# Patient Record
Sex: Male | Born: 1990 | Race: White | Hispanic: No | Marital: Single | Smoking: Current every day smoker
Health system: Southern US, Community
[De-identification: ages and names within clinical notes are randomized; demographics above are authoritative.]

---

## 2009-01-07 ENCOUNTER — Emergency Department (HOSPITAL_COMMUNITY): Admission: EM | Admit: 2009-01-07 | Discharge: 2009-01-07 | Payer: Self-pay | Admitting: Emergency Medicine

## 2009-04-04 ENCOUNTER — Emergency Department (HOSPITAL_BASED_OUTPATIENT_CLINIC_OR_DEPARTMENT_OTHER): Admission: EM | Admit: 2009-04-04 | Discharge: 2009-04-04 | Payer: Self-pay | Admitting: Emergency Medicine

## 2009-04-04 ENCOUNTER — Ambulatory Visit: Payer: Self-pay | Admitting: Diagnostic Radiology
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
## 2021-12-10 ENCOUNTER — Encounter (HOSPITAL_COMMUNITY): Payer: Self-pay | Admitting: Emergency Medicine

## 2021-12-10 ENCOUNTER — Emergency Department (HOSPITAL_COMMUNITY)
Admission: EM | Admit: 2021-12-10 | Discharge: 2021-12-11 | Disposition: A | Discharge status: Other Acute Inpatient Hospital | Payer: BC Managed Care – PPO | Attending: Emergency Medicine | Admitting: Emergency Medicine

## 2021-12-10 ENCOUNTER — Other Ambulatory Visit: Payer: Self-pay

## 2021-12-10 DIAGNOSIS — R45851 Suicidal ideations: Secondary | ICD-10-CM | POA: Insufficient documentation

## 2021-12-10 DIAGNOSIS — Z20822 Contact with and (suspected) exposure to covid-19: Secondary | ICD-10-CM | POA: Diagnosis not present

## 2021-12-10 DIAGNOSIS — F332 Major depressive disorder, recurrent severe without psychotic features: Secondary | ICD-10-CM | POA: Diagnosis present

## 2021-12-10 LAB — CBC WITH DIFFERENTIAL/PLATELET
Abs Immature Granulocytes: 0.02 10*3/uL (ref 0.00–0.07)
Basophils Absolute: 0 10*3/uL (ref 0.0–0.1)
Basophils Relative: 1 %
Eosinophils Absolute: 0.2 10*3/uL (ref 0.0–0.5)
Eosinophils Relative: 3 %
HCT: 45.2 % (ref 39.0–52.0)
Hemoglobin: 15.5 g/dL (ref 13.0–17.0)
Immature Granulocytes: 0 %
Lymphocytes Relative: 31 %
Lymphs Abs: 2.2 10*3/uL (ref 0.7–4.0)
MCH: 29 pg (ref 26.0–34.0)
MCHC: 34.3 g/dL (ref 30.0–36.0)
MCV: 84.6 fL (ref 80.0–100.0)
Monocytes Absolute: 0.5 10*3/uL (ref 0.1–1.0)
Monocytes Relative: 7 %
Neutro Abs: 4 10*3/uL (ref 1.7–7.7)
Neutrophils Relative %: 58 %
Platelets: 249 10*3/uL (ref 150–400)
RBC: 5.34 MIL/uL (ref 4.22–5.81)
RDW: 12.9 % (ref 11.5–15.5)
WBC: 6.9 10*3/uL (ref 4.0–10.5)
nRBC: 0 % (ref 0.0–0.2)

## 2021-12-10 LAB — COMPREHENSIVE METABOLIC PANEL
ALT: 55 U/L — ABNORMAL HIGH (ref 0–44)
AST: 41 U/L (ref 15–41)
Albumin: 4.3 g/dL (ref 3.5–5.0)
Alkaline Phosphatase: 64 U/L (ref 38–126)
Anion gap: 10 (ref 5–15)
BUN: 19 mg/dL (ref 6–20)
CO2: 23 mmol/L (ref 22–32)
Calcium: 9.2 mg/dL (ref 8.9–10.3)
Chloride: 103 mmol/L (ref 98–111)
Creatinine, Ser: 0.93 mg/dL (ref 0.61–1.24)
GFR, Estimated: >60 mL/min (ref >60)
Glucose, Bld: 115 mg/dL — ABNORMAL HIGH (ref 70–99)
Potassium: 3.9 mmol/L (ref 3.5–5.1)
Sodium: 136 mmol/L (ref 135–145)
Total Bilirubin: 0.3 mg/dL (ref 0.3–1.2)
Total Protein: 7.4 g/dL (ref 6.5–8.1)

## 2021-12-10 LAB — RESP PANEL BY RT-PCR (FLU A&B, COVID) ARPGX2
Influenza A by PCR: NEGATIVE
Influenza B by PCR: NEGATIVE
SARS Coronavirus 2 by RT PCR: NEGATIVE

## 2021-12-10 LAB — RAPID URINE DRUG SCREEN, HOSP PERFORMED
Amphetamines: NOT DETECTED
Barbiturates: NOT DETECTED
Benzodiazepines: NOT DETECTED
Cocaine: NOT DETECTED
Opiates: NOT DETECTED
Tetrahydrocannabinol: NOT DETECTED

## 2021-12-10 LAB — SALICYLATE LEVEL: Salicylate Lvl: <7 mg/dL — ABNORMAL LOW (ref 7.0–30.0)

## 2021-12-10 LAB — ETHANOL: Alcohol, Ethyl (B): <10 mg/dL (ref <10)

## 2021-12-10 LAB — ACETAMINOPHEN LEVEL: Acetaminophen (Tylenol), Serum: <10 ug/mL — ABNORMAL LOW (ref 10–30)

## 2021-12-10 MED ORDER — LORAZEPAM 1 MG PO TABS: 1.0000 mg | ORAL_TABLET | ORAL | Status: DC | PRN

## 2021-12-10 NOTE — ED Triage Notes (Signed)
Pt states he just moved here from [place].  Was in a treatment program there for depression but now has moved here to go to college.  Pt states he has a lot of stressors in his life and feels that he wants to die.  Has a knife with plans to end his life.

## 2021-12-10 NOTE — ED Notes (Signed)
The pt is s;eepy

## 2021-12-10 NOTE — ED Provider Triage Note (Signed)
Emergency Medicine Provider Triage Evaluation Note  Benjamin Rivers , a 31 y.o. male  was evaluated in triage.  Pt complains of SI. No prior episode in the past or prior attempts but reports a "back up plan". No HI.  Review of Systems  Positive: Si Negative: Cp, sob, abdominal pain  Physical Exam  BP (!) 148/89   Pulse (!) 106   Temp 99.4 F (37.4 C) (Oral)   Resp 20   SpO2 100%  Gen:   Awake, no distress   Resp:  Normal effort  MSK:   Moves extremities without difficulty  Other:    Medical Decision Making  Medically screening exam initiated at 7:02 PM.  Appropriate orders placed.  Tyquavious Bowden was informed that the remainder of the evaluation will be completed by another provider, this initial triage assessment does not replace that evaluation, and the importance of remaining in the ED until their evaluation is complete.     Claude Manges, PA-C 12/10/21 1905

## 2021-12-10 NOTE — ED Notes (Signed)
Pt was wanded by security and belongings secured. Security Radio producer and locked up in security.

## 2021-12-10 NOTE — ED Notes (Signed)
PT belongings inventoried and placed in locker 6. PT resting at this time. PT has been calm and cooperative.

## 2021-12-10 NOTE — ED Provider Notes (Signed)
[facility] EMERGENCY DEPARTMENT Provider Note   CSN: [id number] Arrival date & time: 12/10/21  1824     History  Chief Complaint  Patient presents with   Suicidal    El Pile is a 31 y.o. male.  HPI  31 year old male with medical history sniffer ADHD and depression who presents to the emergency department with suicidal ideation with a plan.  He states that he has had some conflicts with his family and is new to the area.  He has had thoughts of hurting himself for the last 2 weeks.  His plan will be to stab himself with a knife in a bathtub.  He denies any HI or AVH.  He states that he is on Concerta and an antidepressant.  He presents voluntarily for evaluation.  Home Medications Prior to Admission medications   Not on File      Allergies    Patient has no known allergies.    Review of Systems   Review of Systems  All other systems reviewed and are negative.   Physical Exam Updated Vital Signs BP (!) 148/89   Pulse (!) 106   Temp 99.4 F (37.4 C) (Oral)   Resp 20   SpO2 100%  Physical Exam Vitals and nursing note reviewed.  Constitutional:      General: He is not in acute distress. HENT:     Head: Normocephalic and atraumatic.  Eyes:     Conjunctiva/sclera: Conjunctivae normal.     Pupils: Pupils are equal, round, and reactive to light.  Cardiovascular:     Rate and Rhythm: Normal rate and regular rhythm.  Pulmonary:     Effort: Pulmonary effort is normal. No respiratory distress.  Abdominal:     General: There is no distension.     Tenderness: There is no guarding.  Musculoskeletal:        General: No deformity or signs of injury.     Cervical back: Neck supple.  Skin:    Findings: No lesion or rash.  Neurological:     General: No focal deficit present.     Mental Status: He is alert. Mental status is at baseline.  Psychiatric:        Thought Content: Thought content includes suicidal ideation. Thought content includes  suicidal plan.     ED Results / Procedures / Treatments   Labs (all labs ordered are listed, but only abnormal results are displayed) Labs Reviewed  COMPREHENSIVE METABOLIC PANEL - Abnormal; Notable for the following components:      Result Value   Glucose, Bld 115 (*)    ALT 55 (*)    All other components within normal limits  SALICYLATE LEVEL - Abnormal; Notable for the following components:   Salicylate Lvl <7.0 (*)    All other components within normal limits  ACETAMINOPHEN LEVEL - Abnormal; Notable for the following components:   Acetaminophen (Tylenol), Serum <10 (*)    All other components within normal limits  RESP PANEL BY RT-PCR (FLU A&B, COVID) ARPGX2  ETHANOL  RAPID URINE DRUG SCREEN, HOSP PERFORMED  CBC WITH DIFFERENTIAL/PLATELET    EKG EKG Interpretation  Date/Time:  Wednesday December 10 2021 21:04:15 EDT Ventricular Rate:  83 PR Interval:  154 QRS Duration: 98 QT Interval:  348 QTC Calculation: 408 R Axis:   95 Text Interpretation: Normal sinus rhythm with sinus arrhythmia Rightward axis Inferior infarct , age undetermined Abnormal ECG No previous ECGs available Confirmed by Ernie Avena ([id number]) on 12/10/2021 9:45:44  PM  Radiology No results found.  Procedures Procedures    Medications Ordered in ED Medications  LORazepam (ATIVAN) tablet 1 mg (has no administration in time range)    ED Course/ Medical Decision Making/ A&P                           Medical Decision Making Risk Prescription drug management.   31 year old male with medical history sniffer ADHD and depression who presents to the emergency department with suicidal ideation with a plan.  He states that he has had some conflicts with his family and is new to the area.  He has had thoughts of hurting himself for the last 2 weeks.  His plan will be to stab himself with a knife in a bathtub.  He denies any HI or AVH.  He states that he is on Concerta and an antidepressant.  He presents  voluntarily for evaluation.  On arrival, the patient was vitally stable, afebrile, mildly tachycardic P106, anxious appearing, otherwise reassuring physical exam.  Presenting with SI and a plan in the setting of family stressors.  Screens high risk by the [place] suicide risk rating tool, here voluntarily.  Medically clear for TTS evaluation at this time.     Final Clinical Impression(s) / ED Diagnoses Final diagnoses:  Suicidal ideation    Rx / DC Orders ED Discharge Orders     None         Ernie Avena, MD 12/10/21 2146

## 2021-12-11 ENCOUNTER — Encounter (HOSPITAL_COMMUNITY): Payer: Self-pay

## 2021-12-11 ENCOUNTER — Ambulatory Visit (HOSPITAL_COMMUNITY)
Admission: EM | Admit: 2021-12-11 | Discharge: 2021-12-12 | Disposition: A | Discharge status: Home / Self Care | Payer: BC Managed Care – PPO | Attending: Psychiatry | Admitting: Psychiatry

## 2021-12-11 DIAGNOSIS — R45851 Suicidal ideations: Secondary | ICD-10-CM

## 2021-12-11 MED ORDER — HYDROXYZINE HCL 25 MG PO TABS
25.0000 mg | ORAL_TABLET | Freq: Three times a day (TID) | ORAL | Status: DC | PRN
  Administered 2021-12-11: 25 mg via ORAL
  Filled 2021-12-11: qty 1

## 2021-12-11 MED ORDER — ALUM & MAG HYDROXIDE-SIMETH 200-200-20 MG/5ML PO SUSP: 30.0000 mL | ORAL | Status: DC | PRN

## 2021-12-11 MED ORDER — ACETAMINOPHEN 325 MG PO TABS: 650.0000 mg | ORAL_TABLET | Freq: Four times a day (QID) | ORAL | Status: DC | PRN

## 2021-12-11 MED ORDER — LURASIDONE HCL 60 MG PO TABS
1.0000 | ORAL_TABLET | Freq: Every day | ORAL | Status: DC
  Administered 2021-12-11: 60 mg via ORAL
  Filled 2021-12-11: qty 1

## 2021-12-11 MED ORDER — NICOTINE 14 MG/24HR TD PT24
14.0000 mg | MEDICATED_PATCH | Freq: Every day | TRANSDERMAL | Status: DC
  Administered 2021-12-11 – 2021-12-12 (×2): 14 mg via TRANSDERMAL
  Filled 2021-12-11 (×2): qty 1

## 2021-12-11 MED ORDER — DESVENLAFAXINE SUCCINATE ER 50 MG PO TB24
100.0000 mg | ORAL_TABLET | Freq: Every day | ORAL | Status: DC
  Administered 2021-12-11 – 2021-12-12 (×2): 100 mg via ORAL
  Filled 2021-12-11 (×2): qty 2

## 2021-12-11 MED ORDER — CLONIDINE HCL 0.1 MG PO TABS: 0.1000 mg | ORAL_TABLET | Freq: Two times a day (BID) | ORAL | Status: DC

## 2021-12-11 MED ORDER — MAGNESIUM HYDROXIDE 400 MG/5ML PO SUSP: 30.0000 mL | Freq: Every day | ORAL | Status: DC | PRN

## 2021-12-11 MED ORDER — TRAZODONE HCL 50 MG PO TABS
50.0000 mg | ORAL_TABLET | Freq: Every evening | ORAL | Status: DC | PRN
  Administered 2021-12-11: 50 mg via ORAL
  Filled 2021-12-11: qty 1

## 2021-12-11 NOTE — BH Assessment (Addendum)
AC Hilda Lias said that patient can come to [facility] for continuous assessment.  Dr. Percell Locus accepting.  She provided information in secure messaging for the PA and Dr. Jeanella Anton.
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
## 2021-12-11 NOTE — ED Notes (Addendum)
Patient was admitted to obs from a direct admit from [facility]. Writer was given report by Tobi Bastos, Charity fundraiser. Patient denied SI/HI and AVH. Patient is calm and cooperative. Patient discussed he recently moved from [place] and was trying to figure things out. Patient ate his lunch and lied down to rest.

## 2021-12-11 NOTE — ED Notes (Signed)
The pts  personal belongongs inventpried by tyler nurse tech  they are in locker  number 6 in the purple zone

## 2021-12-11 NOTE — ED Provider Notes (Signed)
[facility] Continuous Assessment Admission H&P  Date: 12/11/21 Patient Name: Benjamin Rivers MRN: [id number] Chief Complaint: No chief complaint on file.    Diagnoses:  Final diagnoses:  Suicidal ideation   HPI:  Pt presents voluntarily to [facility] health as a direct transfer from [facility] to continuous assessment. Pt is assessed face-to-face by nurse practitioner.   Benjamin Rivers, 31 y.o., male patient seen face to face by this provider, consulted with Dr. Lucianne Muss; and chart reviewed on 12/11/21. On evaluation Benjamin Rivers reports hx of bipolar disorder, depression, ADHD, dependent personality disorder, borderline personality disorder.  Pt reports he was recently at a residential mental health facility in [place] called [facility]. He states he came to [facility] as he was accepted to a graduate MBA program which will start in 10 days at [facility]. He states recent stressor for him was meeting with his family. He states his family makes degrading comments about him, such as his weight, and telling him he is wearing weird clothes. He states his father also told him he would not pay for his MBA. He reports this triggered SI w/ plan to cut self in the bathroom. He states he called the suicide hotline at 31. Pt denies he is currently experiencing active SI, w/ a plan or intent. He is however experiencing passive SI, w/o plan or intent. He states he is Benjamin Rivers and that dying by suicide "is not a good way of leading" and indicates "a lack of faith". He states he does have services set up for [facility] on the 17th for medication management and on the 22nd for therapy.   He denies hx of NSSI, SA.   He denies current/hx of VI/HI.  He denies current/hx of AVH, paranoia.  He reports he is not currently working. He states he last worked in June 2023, was working at [employer].  Pt is living alone.  He denies access to a firearm.  He denies use of alcohol, marijuana,  crack/cocaine, other substances.  He gives verbal consent to speak w/ his mother Cala Bradford [phone], although requests he call her first. Encouraged pt to speak w/ his mother and to inform staff and writer will reach out as well.  During evaluation Benjamin Rivers is a&ox3, in no acute distress, non-toxic appearing. He appears appropriate for environment. Eye contact is fair. Speech is clear and coherent w/ nml rate and volume. Reported mood is anxious, depressed. Affect is flat. TP is coherent, goal directed, linear. Description of associations is intact. TC is logical. There is no evidence he is responding to internal stimuli. There is no evidence of agitation, aggression or distractibility. No delusions or paranoia elicited. Pt is calm, cooperative, pleasant.   PHQ 2-9:   Flowsheet Row ED from 12/10/2021 in [facility] EMERGENCY DEPARTMENT  C-SSRS RISK CATEGORY High Risk        Total Time spent with patient: 20 minutes  Musculoskeletal  Strength & Muscle Tone: within normal limits Gait & Station: normal Patient leans: N/A  Psychiatric Specialty Exam  Presentation General Appearance: Appropriate for Environment  Eye Contact:Fair  Speech:Clear and Coherent; Normal Rate  Speech Volume:Normal  Handedness:No data recorded  Mood and Affect  Mood:Anxious; Depressed  Affect:Flat   Thought Process  Thought Processes:Coherent; Goal Directed; Linear  Descriptions of Associations:Intact  Orientation:Full (Time, Place and Person)  Thought Content:Logical  Diagnosis of Schizophrenia or Schizoaffective disorder in past: No   Hallucinations:Hallucinations: None  Ideas of Reference:None  Suicidal Thoughts:Suicidal Thoughts: Yes,  Passive  Homicidal Thoughts:Homicidal Thoughts: No   Sensorium  Memory:Immediate Fair  Judgment:Fair  Insight:Fair   Executive Functions  Concentration:Fair  Attention Span:Fair  Recall:Fair  Fund of  Knowledge:Fair  Language:Fair   Psychomotor Activity  Psychomotor Activity:Psychomotor Activity: Normal   Assets  Assets:Communication Skills; Desire for Improvement; Financial Resources/Insurance; Housing; Social Support   Sleep  Sleep:Sleep: Fair   No data recorded  Physical Exam Cardiovascular:     Rate and Rhythm: Normal rate.  Pulmonary:     Effort: Pulmonary effort is normal.  Neurological:     Mental Status: He is alert and oriented to person, place, and time.    Review of Systems  Constitutional:  Negative for chills and fever.  Respiratory:  Negative for sputum production.   Cardiovascular:  Negative for chest pain and palpitations.  Gastrointestinal:  Negative for abdominal pain.  Neurological:  Negative for dizziness and headaches.    There were no vitals taken for this visit. There is no height or weight on file to calculate BMI.  Past Psychiatric History: Reported hx of bipolar disorder, depression, ADHD, dependent personality disorder, borderline personality disorder  Is the patient at risk to self? No  Has the patient been a risk to self in the past 6 months? Yes .    Has the patient been a risk to self within the distant past? No   Is the patient a risk to others? No   Has the patient been a risk to others in the past 6 months? No   Has the patient been a risk to others within the distant past? No   Past Medical History: No past medical history on file.  The histories are not reviewed yet. Please review them in the "History" navigator section and refresh this SmartLink.  Family History: No family history on file.  Social History:  Social History   Socioeconomic History   Marital status: Single    Spouse name: Not on file   Number of children: Not on file   Years of education: Not on file   Highest education level: Not on file  Occupational History   Not on file  Tobacco Use   Smoking status: Not on file   Smokeless tobacco: Not on file   Substance and Sexual Activity   Alcohol use: Not on file   Drug use: Not on file   Sexual activity: Not on file  Other Topics Concern   Not on file  Social History Narrative   Not on file   Social Determinants of Health   Financial Resource Strain: Not on file  Food Insecurity: Not on file  Transportation Needs: Not on file  Physical Activity: Not on file  Stress: Not on file  Social Connections: Not on file  Intimate Partner Violence: Not on file    SDOH:  SDOH Screenings   Alcohol Screen: Not on file  Depression (PHQ2-9): Not on file  Financial Resource Strain: Not on file  Food Insecurity: Not on file  Housing: Not on file  Physical Activity: Not on file  Social Connections: Not on file  Stress: Not on file  Tobacco Use: Not on file  Transportation Needs: Not on file    Last Labs:  Admission on 12/10/2021, Discharged on 12/11/2021  Component Date Value Ref Range Status   SARS Coronavirus 2 by RT PCR 12/10/2021 NEGATIVE  NEGATIVE Final   Comment: (NOTE) SARS-CoV-2 target nucleic acids are NOT DETECTED.  The SARS-CoV-2 RNA is generally detectable in  upper respiratory specimens during the acute phase of infection. The lowest concentration of SARS-CoV-2 viral copies this assay can detect is 138 copies/mL. A negative result does not preclude SARS-Cov-2 infection and should not be used as the sole basis for treatment or other patient management decisions. A negative result may occur with  improper specimen collection/handling, submission of specimen other than nasopharyngeal swab, presence of viral mutation(s) within the areas targeted by this assay, and inadequate number of viral copies(<138 copies/mL). A negative result must be combined with clinical observations, patient history, and epidemiological information. The expected result is Negative.  Fact Sheet for Patients:  BloggerCourse.com  Fact Sheet for Healthcare Providers:   SeriousBroker.it  This test is no                          t yet approved or cleared by the [place] FDA and  has been authorized for detection and/or diagnosis of SARS-CoV-2 by FDA under an Emergency Use Authorization (EUA). This EUA will remain  in effect (meaning this test can be used) for the duration of the COVID-19 declaration under Section 564(b)(1) of the Act, 21 U.S.C.section 360bbb-3(b)(1), unless the authorization is terminated  or revoked sooner.       Influenza A by PCR 12/10/2021 NEGATIVE  NEGATIVE Final   Influenza B by PCR 12/10/2021 NEGATIVE  NEGATIVE Final   Comment: (NOTE) The Xpert Xpress SARS-CoV-2/FLU/RSV plus assay is intended as an aid in the diagnosis of influenza from Nasopharyngeal swab specimens and should not be used as a sole basis for treatment. Nasal washings and aspirates are unacceptable for Xpert Xpress SARS-CoV-2/FLU/RSV testing.  Fact Sheet for Patients: BloggerCourse.com  Fact Sheet for Healthcare Providers: SeriousBroker.it  This test is not yet approved or cleared by the [place] FDA and has been authorized for detection and/or diagnosis of SARS-CoV-2 by FDA under an Emergency Use Authorization (EUA). This EUA will remain in effect (meaning this test can be used) for the duration of the COVID-19 declaration under Section 564(b)(1) of the Act, 21 U.S.C. section 360bbb-3(b)(1), unless the authorization is terminated or revoked.  Performed at [facility], [address] [place], [place] [address]    Sodium 12/10/2021 136  135 - 145 mmol/L Final   Potassium 12/10/2021 3.9  3.5 - 5.1 mmol/L Final   HEMOLYSIS AT THIS LEVEL MAY AFFECT RESULT   Chloride 12/10/2021 103  98 - 111 mmol/L Final   CO2 12/10/2021 23  22 - 32 mmol/L Final   Glucose, Bld 12/10/2021 115 (H)  70 - 99 mg/dL Final   Glucose reference range applies only to samples taken after  fasting for at least 8 hours.   BUN 12/10/2021 19  6 - 20 mg/dL Final   Creatinine, Ser 12/10/2021 0.93  0.61 - 1.24 mg/dL Final   Calcium 59/74/1638 9.2  8.9 - 10.3 mg/dL Final   Total Protein 45/36/4680 7.4  6.5 - 8.1 g/dL Final   POST-ULTRACENTRIFUGATION   Albumin 12/10/2021 4.3  3.5 - 5.0 g/dL Final   AST 32/04/2481 41  15 - 41 U/L Final   Comment: HEMOLYSIS AT THIS LEVEL MAY AFFECT RESULT LIPEMIC SPECIMEN, RESULTS MAY BE AFFECTED.    ALT 12/10/2021 55 (H)  0 - 44 U/L Final   Comment: HEMOLYSIS AT THIS LEVEL MAY AFFECT RESULT LIPEMIC SPECIMEN, RESULTS MAY BE AFFECTED.    Alkaline Phosphatase 12/10/2021 64  38 - 126 U/L Final   Total Bilirubin 12/10/2021  0.3  0.3 - 1.2 mg/dL Final   HEMOLYSIS AT THIS LEVEL MAY AFFECT RESULT   GFR, Estimated 12/10/2021 >60  >60 mL/min Final   Comment: (NOTE) Calculated using the CKD-EPI Creatinine Equation (2021)    Anion gap 12/10/2021 10  5 - 15 Final   Performed at [facility], [address], [place], [place] [address]   Alcohol, Ethyl (B) 12/10/2021 <10  <10 mg/dL Final   Comment: (NOTE) Lowest detectable limit for serum alcohol is 10 mg/dL.  For medical purposes only. Performed at [facility], [address] [place], [place] [address]    Opiates 12/10/2021 NONE DETECTED  NONE DETECTED Final   Cocaine 12/10/2021 NONE DETECTED  NONE DETECTED Final   Benzodiazepines 12/10/2021 NONE DETECTED  NONE DETECTED Final   Amphetamines 12/10/2021 NONE DETECTED  NONE DETECTED Final   Tetrahydrocannabinol 12/10/2021 NONE DETECTED  NONE DETECTED Final   Barbiturates 12/10/2021 NONE DETECTED  NONE DETECTED Final   Comment: (NOTE) DRUG SCREEN FOR MEDICAL PURPOSES ONLY.  IF CONFIRMATION IS NEEDED FOR ANY PURPOSE, NOTIFY LAB WITHIN 5 DAYS.  LOWEST DETECTABLE LIMITS FOR URINE DRUG SCREEN Drug Sch[place]ani                     Cutoff (ng/mL) Amphetamine and metabolites    1000 Barbiturate and metabolites    200 Benzodiazepine                  200 Tricyclics and metabolites     300 Opiates and metabolites        300 Cocaine and metabolites        300 THC                            50 Performed at [facility], [address], [place], [place] [address]    WBC 12/10/2021 6.9  4.0 - 10.5 K/uL Final   RBC 12/10/2021 5.34  4.22 - 5.81 MIL/uL Final   Hemoglobin 12/10/2021 15.5  13.0 - 17.0 g/dL Final   HCT 19/50/9326 45.2  39.0 - 52.0 % Final   MCV 12/10/2021 84.6  80.0 - 100.0 fL Final   MCH 12/10/2021 29.0  26.0 - 34.0 pg Final   MCHC 12/10/2021 34.3  30.0 - 36.0 g/dL Final   RDW 71/24/5809 12.9  11.5 - 15.5 % Final   Platelets 12/10/2021 249  150 - 400 K/uL Final   nRBC 12/10/2021 0.0  0.0 - 0.2 % Final   Neutrophils Relative % 12/10/2021 58  % Final   Neutro Abs 12/10/2021 4.0  1.7 - 7.7 K/uL Final   Lymphocytes Relative 12/10/2021 31  % Final   Lymphs Abs 12/10/2021 2.2  0.7 - 4.0 K/uL Final   Monocytes Relative 12/10/2021 7  % Final   Monocytes Absolute 12/10/2021 0.5  0.1 - 1.0 K/uL Final   Eosinophils Relative 12/10/2021 3  % Final   Eosinophils Absolute 12/10/2021 0.2  0.0 - 0.5 K/uL Final   Basophils Relative 12/10/2021 1  % Final   Basophils Absolute 12/10/2021 0.0  0.0 - 0.1 K/uL Final   Immature Granulocytes 12/10/2021 0  % Final   Abs Immature Granulocytes 12/10/2021 0.02  0.00 - 0.07 K/uL Final   Performed at [facility], [address] [place], [place] [address]   Salicylate Lvl 12/10/2021 <7.0 (L)  7.0 - 30.0 mg/dL Final   Performed at [facility], [address] [place], [place] [address]   Acetaminophen (Tylenol), Serum 12/10/2021 <10 (L)  10 - 30 ug/mL Final   Comment: (NOTE) Therapeutic concentrations vary significantly. A range of 10-30 ug/mL  may be an effective concentration for many patients. However, some  are best treated at concentrations outside of this range. Acetaminophen concentrations >150 ug/mL at 4 hours after ingestion  and >50 ug/mL at 12 hours after ingestion are  often associated with  toxic reactions.  Performed at [facility], [address] [place], [place] [address]     Allergies: Lamotrigine  PTA Medications: (Not in a hospital admission)   Medical Decision Making  Pt admitted to continuous assessment as [facility].   Recommendations  Based on my evaluation the patient does not appear to have an emergency medical condition.  Lauree ChandlerJacqueline Eun Sameen Leas, NP 12/11/21  1:45 PM

## 2021-12-11 NOTE — ED Notes (Signed)
Pt sitting on side of bed at this hour. No apparent distress. RR even and unlabored. Monitored for safety.

## 2021-12-11 NOTE — ED Notes (Signed)
Report called to Marjenia at [facility].

## 2021-12-11 NOTE — Progress Notes (Signed)
Progress note   D: Pt seen in his bed. Pt denies SI, HI, AVH. Pt rates pain  0/10. Pt rates anxiety  0/10 and depression  10/10. Pt ruminating on past relationship breakups and leaving the program he was recently in in [place]. Pt distressed because his parents have pulled support for his return to graduate school at [employer]. Pt worried about what is next for him.   A: Pt provided support and encouragement. Pt given scheduled medication as prescribed. PRNs as appropriate.   R: Pt safe on the unit. Will continue to monitor.

## 2021-12-11 NOTE — ED Notes (Signed)
PT in room 3 for TTS

## 2021-12-11 NOTE — ED Notes (Signed)
Pt lying on bed asleep at this hour. No apparent distress. RR even and unlabored. Monitored for safety.  

## 2021-12-12 DIAGNOSIS — R45851 Suicidal ideations: Secondary | ICD-10-CM

## 2021-12-12 NOTE — ED Provider Notes (Signed)
FBC/OBS ASAP Discharge Summary  Date and Time: 12/12/2021 9:50 AM  Name: Benjamin Rivers  MRN:  [id number]   Discharge Diagnoses:  Final diagnoses:  Suicidal ideation   Subjective:   Pt reports current euthymic mood. He denies SI/VI/HI, AVH, paranoia. He verbalizes readiness for discharge. He states he needs access to his computer and belongings so he can find out the next step in his plan. He reports following discharge, his plan is to buy groceries. 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 He reports long term he wants to have a girlfriend and a family to have connections. He gives verbal consent to speak w/ his mother, Selena Batten, at [phone]. Safety planning completed as below:  Safety plan: Warning signs: Crying Feeling overwhelmed Not getting enough sleep Coping skills Sleeping Eating healthy Working out People/facilities I can contact: Kathryne Sharper Mom [phone]  Collateral w/ Selena Batten, [phone]. Selena Batten states she has spoken w/ pt while pt was on the unit. She states pt has had multiple psychiatric dxs including bipolar disorder, ADHD, depression, unspecified personality disorder. She reports pt is easily offended and "favorite word is boundaries". She states she and pt's father are willing to pay for pt's [facility] program and they want to see more initiative for him. She reports "Joselyn Glassman knows and feels suicide is wrong". She denies knowledge of hx of SA. She denies pt is at risk for himself or others. She states she can continue to be a support for pt as he discharges today.   Pt has follow up appointments for medication management and counseling at [facility] He is able to verbalizes that he plans on attending these appointments. He states his appointment on August 17 is for medication management and on August 22 is for therapy. Pt easily verbally contracts to safety for himself and others.  Stay Summary:  Pt is a 31 y/o  male w/ reported hx of bipolar disorder, depression, ADHD, dependent personality disorder, borderline personality disorder, presenting to [facility] on 12/11/21 as a direct admit to continuous assessment from [facility]. On reassessment today, pt denies SI/VI/HI, AVH, paranoia. Collateral denies pt is at risk for himself or others. Pt has been discharged.  Total Time spent with patient: 45 minutes  Past Psychiatric History: Reported hx of bipolar disorder, depression, ADHD, dependent personality disorder, borderline personality disorder Past Medical History: History reviewed. No pertinent past medical history. History reviewed. No pertinent surgical history. Family History: History reviewed. No pertinent family history. Family Psychiatric History: Pt denies family psychiatric hx Social History:  Social History   Substance and Sexual Activity  Alcohol Use None     Social History   Substance and Sexual Activity  Drug Use Not on file    Social History   Socioeconomic History   Marital status: Single    Spouse name: Not on file   Number of children: Not on file   Years of education: Not on file   Highest education level: Not on file  Occupational History   Not on file  Tobacco Use   Smoking status: Every Day    Types: Cigarettes   Smokeless tobacco: Never  Vaping Use   Vaping Use: Every day   Substances: Nicotine  Substance and Sexual Activity   Alcohol use: Not on file   Drug use: Not on file   Sexual activity: Not on file  Other Topics Concern   Not on file  Social History Narrative  Not on file   Social Determinants of Health   Financial Resource Strain: Not on file  Food Insecurity: Not on file  Transportation Needs: Not on file  Physical Activity: Not on file  Stress: Not on file  Social Connections: Not on file   SDOH:  SDOH Screenings   Alcohol Screen: Not on file  Depression (PHQ2-9): Not on file  Financial Resource Strain: Not on file  Food Insecurity: Not on  file  Housing: Not on file  Physical Activity: Not on file  Social Connections: Not on file  Stress: Not on file  Tobacco Use: High Risk (12/11/2021)   Patient History    Smoking Tobacco Use: Every Day    Smokeless Tobacco Use: Never    Passive Exposure: Not on file  Transportation Needs: Not on file    Tobacco Cessation:  A prescription for an FDA-approved tobacco cessation medication was offered at discharge and the patient refused  Current Medications:  Current Facility-Administered Medications  Medication Dose Route Frequency Provider Last Rate Last Admin   acetaminophen (TYLENOL) tablet 650 mg  650 mg Oral Q6H PRN Lauree Chandler, NP       alum & mag hydroxide-simeth (MAALOX/MYLANTA) 200-200-20 MG/5ML suspension 30 mL  30 mL Oral Q4H PRN Lauree Chandler, NP       desvenlafaxine (PRISTIQ) 24 hr tablet 100 mg  100 mg Oral Daily Lauree Chandler, NP   100 mg at 12/12/21 1937   hydrOXYzine (ATARAX) tablet 25 mg  25 mg Oral TID PRN Lauree Chandler, NP   25 mg at 12/11/21 2106   Lurasidone HCl TABS 60 mg  1 tablet Oral QHS Lauree Chandler, NP   60 mg at 12/11/21 2106   magnesium hydroxide (MILK OF MAGNESIA) suspension 30 mL  30 mL Oral Daily PRN Lauree Chandler, NP       nicotine (NICODERM CQ - dosed in mg/24 hours) patch 14 mg  14 mg Transdermal Daily Lenard Lance, FNP   14 mg at 12/12/21 0908   traZODone (DESYREL) tablet 50 mg  50 mg Oral QHS PRN Lauree Chandler, NP   50 mg at 12/11/21 2106   Current Outpatient Medications  Medication Sig Dispense Refill   cloNIDine (CATAPRES) 0.1 MG tablet Take 1 tablet by mouth 2 (two) times daily.     Desvenlafaxine ER 100 MG TB24 Take 1 tablet by mouth daily.     hydrOXYzine (ATARAX) 10 MG tablet Take 2 tablets by mouth at bedtime.     LORazepam (ATIVAN) 1 MG tablet Take 1 tablet by mouth at bedtime.     Lurasidone HCl 60 MG TABS Take 1 tablet by mouth at bedtime.     methylphenidate 54 MG PO CR tablet Take 1 tablet  by mouth daily. At 6am      PTA Medications: (Not in a hospital admission)       No data to display          Flowsheet Row ED from 12/10/2021 in [facility] EMERGENCY DEPARTMENT  C-SSRS RISK CATEGORY High Risk       Musculoskeletal  Strength & Muscle Tone: within normal limits Gait & Station: normal Patient leans: N/A  Psychiatric Specialty Exam  Presentation  General Appearance: Appropriate for Environment  Eye Contact:Fair  Speech:Clear and Coherent; Normal Rate  Speech Volume:Normal  Handedness:No data recorded  Mood and Affect  Mood:Euthymic  Affect:Flat   Thought Process  Thought Processes:Coherent; Goal Directed; Linear  Descriptions of Associations:Intact  Orientation:Full (Time, Place and Person)  Thought Content:Logical  Diagnosis of Schizophrenia or Schizoaffective disorder in past: No    Hallucinations:Hallucinations: None  Ideas of Reference:None  Suicidal Thoughts:Suicidal Thoughts: No  Homicidal Thoughts:Homicidal Thoughts: No   Sensorium  Memory:Immediate Fair  Judgment:Fair  Insight:Fair   Executive Functions  Concentration:Fair  Attention Span:Fair  Recall:Fair  Fund of Knowledge:Fair  Language:Fair   Psychomotor Activity  Psychomotor Activity:Psychomotor Activity: Normal   Assets  Assets:Communication Skills; Desire for Improvement; Financial Resources/Insurance; Housing; Social Support   Sleep  Sleep:Sleep: Fair   No data recorded  Physical Exam  Physical Exam Cardiovascular:     Rate and Rhythm: Normal rate.  Pulmonary:     Effort: Pulmonary effort is normal.  Neurological:     Mental Status: He is alert and oriented to person, place, and time.  Psychiatric:        Attention and Perception: Attention and perception normal.        Mood and Affect: Mood normal. Affect is flat.        Speech: Speech normal.        Behavior: Behavior normal. Behavior is cooperative.         Thought Content: Thought content normal.    Review of Systems  Constitutional:  Negative for chills and fever.  Respiratory:  Negative for shortness of breath.   Cardiovascular:  Negative for chest pain and palpitations.  Gastrointestinal:  Negative for abdominal pain.  Neurological:  Negative for dizziness and headaches.  Psychiatric/Behavioral: Negative.     Blood pressure (!) 133/97, pulse 69, temperature 97.9 F (36.6 C), temperature source Oral, resp. rate 18, SpO2 100 %. There is no height or weight on file to calculate BMI.  Demographic Factors:  Male, Caucasian, and Living alone  Loss Factors: NA  Historical Factors: NA  Risk Reduction Factors:   Religious beliefs about death and Positive social support  Continued Clinical Symptoms:  Previous Psychiatric Diagnoses and Treatments  Cognitive Features That Contribute To Risk:  None    Suicide Risk:  Mild:  Suicidal ideation of limited frequency, intensity, duration, and specificity.  There are no identifiable plans, no associated intent, mild dysphoria and related symptoms, good self-control (both objective and subjective assessment), few other risk factors, and identifiable protective factors, including available and accessible social support.  Plan Of Care/Follow-up recommendations:  Follow up with outpatient medication management and counseling  Disposition:  Discharge  Lauree Chandler, NP 12/12/2021, 9:50 AM

## 2021-12-12 NOTE — ED Notes (Signed)
Pt is awake at this hour d/t other patient coughing. No apparent distress. RR even and unlabored. Monitored for safety.

## 2021-12-12 NOTE — ED Notes (Signed)
Pt Aox4.  Denies SI, HI, and AVH.  Reports good sleep overnight.  Pt states he feels like he is ready to discharge home today pending provider speaking with his mother.  Breathing is even and unlabored.  Breakfast has been provided.  Will continue to monitor for safety.

## 2021-12-12 NOTE — Discharge Instructions (Addendum)
Please follow up with your outpatient appointments at [facility] for medication management and therapy.  Patient is instructed prior to discharge to: Take all medications as prescribed by his/her mental healthcare provider. Report any adverse effects and or reactions from the medicines to his/her outpatient provider promptly. Keep all scheduled appointments, to ensure that you are getting refills on time and to avoid any interruption in your medication.  If you are unable to keep an appointment call to reschedule.  Be sure to follow-up with resources and follow-up appointments provided.  Patient has been instructed & cautioned: To not engage in alcohol and or illegal drug use while on prescription medicines. In the event of worsening symptoms, patient is instructed to call the crisis hotline, 911 and or go to the nearest ED for appropriate evaluation and treatment of symptoms. To follow-up with his/her primary care provider for your other medical issues, concerns and or health care needs.    Safety plan: Warning signs: Crying Feeling overwhelmed Not getting enough sleep Coping skills Sleeping Eating healthy Working out People/facilities I can contact: Kathryne Sharper Mom [phone]

## 2021-12-19 ENCOUNTER — Telehealth (HOSPITAL_COMMUNITY): Payer: Self-pay

## 2021-12-19 NOTE — BH Assessment (Signed)
Care Management - [facility] Follow Up Discharges   Writer attempted to make contact with patient today and was unsuccessful.  Phone just rang.  Per chart review, patient reports that he will follow up with his established provider at [facility] on 12-18-21 for med mgt and 12-23-21 for outpatient therapy.
